# Patient Record
Sex: Female | Born: 1965 | Race: White | Hispanic: No | Marital: Married | Smoking: Never smoker
Health system: Southern US, Community
[De-identification: ages and names within clinical notes are randomized; demographics above are authoritative.]

## PROBLEM LIST (undated history)

## (undated) DIAGNOSIS — E079 Disorder of thyroid, unspecified: Secondary | ICD-10-CM

## (undated) DIAGNOSIS — E785 Hyperlipidemia, unspecified: Secondary | ICD-10-CM

## (undated) DIAGNOSIS — K219 Gastro-esophageal reflux disease without esophagitis: Secondary | ICD-10-CM

## (undated) HISTORY — PX: CHOLECYSTECTOMY: SHX55

---

## 2004-04-18 ENCOUNTER — Emergency Department: Payer: Self-pay | Admitting: Emergency Medicine

## 2004-04-19 ENCOUNTER — Ambulatory Visit: Payer: Self-pay | Admitting: Emergency Medicine

## 2006-12-29 ENCOUNTER — Ambulatory Visit: Payer: Self-pay | Admitting: Emergency Medicine

## 2007-08-22 ENCOUNTER — Ambulatory Visit: Payer: Self-pay | Admitting: Internal Medicine

## 2009-12-26 ENCOUNTER — Emergency Department: Payer: Self-pay | Admitting: Unknown Physician Specialty

## 2010-07-16 ENCOUNTER — Emergency Department: Payer: Self-pay | Admitting: Emergency Medicine

## 2010-08-27 ENCOUNTER — Emergency Department: Payer: Self-pay | Admitting: Unknown Physician Specialty

## 2012-11-17 ENCOUNTER — Emergency Department: Payer: Self-pay | Admitting: Emergency Medicine

## 2012-11-17 LAB — URINALYSIS, COMPLETE
Bilirubin,UR: NEGATIVE
Glucose,UR: NEGATIVE mg/dL (ref 0–75)
Ketone: NEGATIVE
Nitrite: NEGATIVE
Ph: 6 (ref 4.5–8.0)
Protein: NEGATIVE
Squamous Epithelial: 4
WBC UR: 7 /HPF (ref 0–5)

## 2012-11-17 LAB — COMPREHENSIVE METABOLIC PANEL
Albumin: 3.5 g/dL (ref 3.4–5.0)
Alkaline Phosphatase: 71 U/L (ref 50–136)
Bilirubin,Total: 0.2 mg/dL (ref 0.2–1.0)
Calcium, Total: 9.3 mg/dL (ref 8.5–10.1)
Chloride: 102 mmol/L (ref 98–107)
Creatinine: 0.86 mg/dL (ref 0.60–1.30)
Glucose: 119 mg/dL — ABNORMAL HIGH (ref 65–99)
Osmolality: 279 (ref 275–301)
Potassium: 3.9 mmol/L (ref 3.5–5.1)
SGOT(AST): 23 U/L (ref 15–37)
SGPT (ALT): 25 U/L (ref 12–78)

## 2012-11-17 LAB — CBC
HCT: 35.6 % (ref 35.0–47.0)
MCH: 31.1 pg (ref 26.0–34.0)
MCHC: 34.7 g/dL (ref 32.0–36.0)
MCV: 90 fL (ref 80–100)
Platelet: 261 10*3/uL (ref 150–440)
RBC: 3.97 10*6/uL (ref 3.80–5.20)
RDW: 13.5 % (ref 11.5–14.5)
WBC: 9 10*3/uL (ref 3.6–11.0)

## 2012-11-17 LAB — PREGNANCY, URINE: Pregnancy Test, Urine: NEGATIVE m[IU]/mL

## 2012-11-17 LAB — TROPONIN I: Troponin-I: <0.02 ng/mL

## 2015-08-26 ENCOUNTER — Encounter: Payer: Self-pay | Admitting: Emergency Medicine

## 2015-08-26 ENCOUNTER — Emergency Department
Admission: EM | Admit: 2015-08-26 | Discharge: 2015-08-26 | Disposition: A | Discharge status: Home / Self Care | Payer: Medicaid Other | Attending: Emergency Medicine | Admitting: Emergency Medicine

## 2015-08-26 DIAGNOSIS — M79651 Pain in right thigh: Secondary | ICD-10-CM | POA: Diagnosis not present

## 2015-08-26 DIAGNOSIS — E785 Hyperlipidemia, unspecified: Secondary | ICD-10-CM | POA: Diagnosis not present

## 2015-08-26 DIAGNOSIS — M791 Myalgia, unspecified site: Secondary | ICD-10-CM

## 2015-08-26 DIAGNOSIS — M79652 Pain in left thigh: Secondary | ICD-10-CM | POA: Diagnosis present

## 2015-08-26 DIAGNOSIS — E079 Disorder of thyroid, unspecified: Secondary | ICD-10-CM | POA: Diagnosis not present

## 2015-08-26 HISTORY — DX: Disorder of thyroid, unspecified: E07.9

## 2015-08-26 HISTORY — DX: Hyperlipidemia, unspecified: E78.5

## 2015-08-26 HISTORY — DX: Gastro-esophageal reflux disease without esophagitis: K21.9

## 2015-08-26 LAB — CBC WITH DIFFERENTIAL/PLATELET
Basophils Absolute: 0.1 10*3/uL (ref 0–0.1)
Eosinophils Absolute: 0.1 10*3/uL (ref 0–0.7)
Eosinophils Relative: 2 %
HCT: 36.4 % (ref 35.0–47.0)
HEMOGLOBIN: 12.3 g/dL (ref 12.0–16.0)
LYMPHS ABS: 2.5 10*3/uL (ref 1.0–3.6)
MCH: 31.1 pg (ref 26.0–34.0)
MCHC: 33.8 g/dL (ref 32.0–36.0)
MCV: 92 fL (ref 80.0–100.0)
Monocytes Absolute: 0.4 10*3/uL (ref 0.2–0.9)
NEUTROS ABS: 5.3 10*3/uL (ref 1.4–6.5)
Neutrophils Relative %: 62 %
Platelets: 206 10*3/uL (ref 150–440)
RBC: 3.96 MIL/uL (ref 3.80–5.20)
RDW: 14.1 % (ref 11.5–14.5)
WBC: 8.3 10*3/uL (ref 3.6–11.0)

## 2015-08-26 LAB — COMPREHENSIVE METABOLIC PANEL
ALBUMIN: 4.1 g/dL (ref 3.5–5.0)
ALK PHOS: 61 U/L (ref 38–126)
ALT: 18 U/L (ref 14–54)
ANION GAP: 8 (ref 5–15)
AST: 29 U/L (ref 15–41)
BUN: 17 mg/dL (ref 6–20)
CALCIUM: 9.6 mg/dL (ref 8.9–10.3)
CHLORIDE: 103 mmol/L (ref 101–111)
CO2: 24 mmol/L (ref 22–32)
Creatinine, Ser: 0.89 mg/dL (ref 0.44–1.00)
GFR calc Af Amer: >60 mL/min (ref >60)
GFR calc non Af Amer: >60 mL/min (ref >60)
GLUCOSE: 107 mg/dL — AB (ref 65–99)
Potassium: 4 mmol/L (ref 3.5–5.1)
Sodium: 135 mmol/L (ref 135–145)
Total Bilirubin: 0.5 mg/dL (ref 0.3–1.2)
Total Protein: 8.1 g/dL (ref 6.5–8.1)

## 2015-08-26 MED ORDER — METHOCARBAMOL 750 MG PO TABS: 750.0000 mg | ORAL_TABLET | Freq: Four times a day (QID) | ORAL | Status: DC

## 2015-08-26 MED ORDER — TRAMADOL HCL 50 MG PO TABS: 50.0000 mg | ORAL_TABLET | Freq: Four times a day (QID) | ORAL | Status: AC | PRN

## 2015-08-26 MED ORDER — NAPROXEN 500 MG PO TABS: 500.0000 mg | ORAL_TABLET | Freq: Two times a day (BID) | ORAL | Status: DC

## 2015-08-26 MED ORDER — NAPROXEN 500 MG PO TABS
500.0000 mg | ORAL_TABLET | Freq: Once | ORAL | Status: AC
  Administered 2015-08-26: 500 mg via ORAL
  Filled 2015-08-26: qty 1

## 2015-08-26 MED ORDER — METHOCARBAMOL 500 MG PO TABS
1000.0000 mg | ORAL_TABLET | Freq: Once | ORAL | Status: AC
  Administered 2015-08-26: 1000 mg via ORAL
  Filled 2015-08-26: qty 2

## 2015-08-26 MED ORDER — KETOROLAC TROMETHAMINE 60 MG/2ML IM SOLN
30.0000 mg | Freq: Once | INTRAMUSCULAR | Status: AC
  Administered 2015-08-26: 30 mg via INTRAMUSCULAR
  Filled 2015-08-26: qty 2

## 2015-08-26 NOTE — ED Provider Notes (Signed)
[facility] Emergency Department Provider Note   ____________________________________________  Time seen: Approximately 1:15 PM  I have reviewed the triage vital signs and the nursing notes.   HISTORY  Chief Complaint Leg Pain    HPI Connie Cooper is a 50 y.o. female patient complaining of bilateral thigh pain for 2 days. Patient stated pain increases with walking. Patient denies any back pain with this complaint. Patient state twice a day she has a walker but inclined when she takes a child to the bus stop and pick her up. Patient denies any chest pain or shortness of breath. Patient is also concerned because she has multiple varicose veins in her lower extremities. No palliative measures taken for this complaint.Patient rates the pain as a 4/10. Patient describes the pain as radiating from dull to sharp.   Past Medical History  Diagnosis Date  . Hyperlipemia   . GERD (gastroesophageal reflux disease)   . Thyroid disease     There are no active problems to display for this patient.   History reviewed. No pertinent past surgical history.  Current Outpatient Rx  Name  Route  Sig  Dispense  Refill  . methocarbamol (ROBAXIN-750) 750 MG tablet   Oral   Take 1 tablet (750 mg total) by mouth 4 (four) times daily.   20 tablet   0   . naproxen (NAPROSYN) 500 MG tablet   Oral   Take 1 tablet (500 mg total) by mouth 2 (two) times daily with a meal.   20 tablet   0   . traMADol (ULTRAM) 50 MG tablet   Oral   Take 1 tablet (50 mg total) by mouth every 6 (six) hours as needed.   20 tablet   0     Allergies Review of patient's allergies indicates no known allergies.  History reviewed. No pertinent family history.  Social History Social History  Substance Use Topics  . Smoking status: Never Smoker   . Smokeless tobacco: None  . Alcohol Use: No    Review of Systems Constitutional: No fever/chills Eyes: No visual changes. ENT: No sore  throat. Cardiovascular: Denies chest pain. Respiratory: Denies shortness of breath. Gastrointestinal: No abdominal pain.  No nausea, no vomiting.  No diarrhea.  No constipation. Genitourinary: Negative for dysuria. Musculoskeletal: Bilateral thigh pain Skin: Negative for rash. Neurological: Negative for headaches, focal weakness or numbness. Endocrine:Hyperlipidemia and hypothyroidism.  ____________________________________________   PHYSICAL EXAM:  VITAL SIGNS: ED Triage Vitals  Enc Vitals Group     BP 08/26/15 1306 125/96 mmHg     Pulse Rate 08/26/15 1306 78     Resp 08/26/15 1306 20     Temp 08/26/15 1306 98 F (36.7 C)     Temp Source 08/26/15 1306 Oral     SpO2 08/26/15 1306 100 %     Weight 08/26/15 1306 200 lb (90.719 kg)     Height 08/26/15 1306 5\' 3"  (1.6 m)     Head Cir --      Peak Flow --      Pain Score 08/26/15 1307 4     Pain Loc --      Pain Edu? --      Excl. in GC? --     Constitutional: Alert and oriented. Well appearing and in no acute distress. Eyes: Conjunctivae are normal. PERRL. EOMI. Head: Atraumatic. Nose: No congestion/rhinnorhea. Mouth/Throat: Mucous membranes are moist.  Oropharynx non-erythematous. Neck: No stridor.  No cervical spine tenderness to palpation.  Hematological/Lymphatic/Immunilogical: No cervical lymphadenopathy. Cardiovascular: Normal rate, regular rhythm. Grossly normal heart sounds.  Good peripheral circulation. Respiratory: Normal respiratory effort.  No retractions. Lungs CTAB. Gastrointestinal: Soft and nontender. No distention. No abdominal bruits. No CVA tenderness. Musculoskeletal:No loss deformities of the lower extremities. Multiple varicose veins. Patient tender palpation to anterior thighs. Neurologic:  Normal speech and language. No gross focal neurologic deficits are appreciated. No gait instability. Skin:  Skin is warm, dry and intact. No rash noted. Psychiatric: Mood and affect are normal. Speech and behavior  are normal.  ____________________________________________   LABS (all labs ordered are listed, but only abnormal results are displayed)  Labs Reviewed  COMPREHENSIVE METABOLIC PANEL - Abnormal; Notable for the following:    Glucose, Bld 107 (*)    All other components within normal limits  CBC WITH DIFFERENTIAL/PLATELET   ____________________________________________  EKG   ____________________________________________  RADIOLOGY   ____________________________________________   PROCEDURES  Procedure(s) performed: None  Critical Care performed: No  ____________________________________________   INITIAL IMPRESSION / ASSESSMENT AND PLAN / ED COURSE  Pertinent labs & imaging results that were available during my care of the patient were reviewed by me and considered in my medical decision making (see chart for details).  Muscle strain. Patient given discharge Instructions. Patient given a prescription for Robaxin, naproxen, tramadol. Patient advised follow-up with her family doctor no improvement in one week. Return to ER for condition worsens. ____________________________________________   FINAL CLINICAL IMPRESSION(S) / ED DIAGNOSES  Final diagnoses:  Myalgia      NEW MEDICATIONS STARTED DURING THIS VISIT:  New Prescriptions   METHOCARBAMOL (ROBAXIN-750) 750 MG TABLET    Take 1 tablet (750 mg total) by mouth 4 (four) times daily.   NAPROXEN (NAPROSYN) 500 MG TABLET    Take 1 tablet (500 mg total) by mouth 2 (two) times daily with a meal.   TRAMADOL (ULTRAM) 50 MG TABLET    Take 1 tablet (50 mg total) by mouth every 6 (six) hours as needed.     Note:  This document was prepared using Dragon voice recognition software and may include unintentional dictation errors.     Joni Reining, PA-C 08/26/15 1412  Jene Every, MD 08/26/15 [phone]

## 2015-08-26 NOTE — ED Notes (Signed)
Pt to ed with c/o bilat upper leg pain x 2 days.  Pt states increased pain with walking.  Pt denies back pain.

## 2016-06-27 ENCOUNTER — Other Ambulatory Visit: Payer: Self-pay | Admitting: Family Medicine

## 2016-06-27 DIAGNOSIS — Z1231 Encounter for screening mammogram for malignant neoplasm of breast: Secondary | ICD-10-CM

## 2016-07-28 ENCOUNTER — Ambulatory Visit: Payer: Medicaid Other

## 2016-09-12 ENCOUNTER — Encounter: Payer: Self-pay | Admitting: Emergency Medicine

## 2016-09-12 ENCOUNTER — Emergency Department
Admission: EM | Admit: 2016-09-12 | Discharge: 2016-09-12 | Disposition: A | Discharge status: Home / Self Care | Payer: Medicaid Other | Attending: Student in an Organized Health Care Education/Training Program | Admitting: Student in an Organized Health Care Education/Training Program

## 2016-09-12 DIAGNOSIS — Z79899 Other long term (current) drug therapy: Secondary | ICD-10-CM | POA: Insufficient documentation

## 2016-09-12 DIAGNOSIS — R200 Anesthesia of skin: Secondary | ICD-10-CM | POA: Diagnosis present

## 2016-09-12 DIAGNOSIS — R202 Paresthesia of skin: Secondary | ICD-10-CM | POA: Insufficient documentation

## 2016-09-12 MED ORDER — GABAPENTIN 300 MG PO CAPS: 300.0000 mg | ORAL_CAPSULE | Freq: Three times a day (TID) | ORAL | 1 refills | Status: AC

## 2016-09-12 NOTE — ED Notes (Signed)
Here for hands and finger numbness, seen for the same in the past and told it was carpal tunnel. States "I don't think that is right".

## 2016-09-12 NOTE — Discharge Instructions (Signed)
You should follow-up with your provider for continued symptoms and diagnostic testing. Take the medicine for nerve irritation as directed.

## 2016-09-12 NOTE — ED Provider Notes (Signed)
[facility] Emergency Department Provider Note ____________________________________________  Time seen: 2141  I have reviewed the triage vital signs and the nursing notes.  HISTORY  Chief Complaint  Numbness  HPI Connie Cooper is a 51 y.o. female visits to the ED for evaluation of numbness from her hands to her arms or the past several months. The patient describes being evaluated by her primary care provider, and been told she has carpal tunnel syndrome. She describes a primary provider put her on 400 mg ibuprofen daily, she denies any significant benefit. She claims she is not sure that that is what her condition actually is. She reports generalized paresthesias and numbness to the hands bilaterally as well as referred pain up the forearms and into the upper shoulders. She denies any significant swelling of the hands, skin Color changes, or grip changes. She has not been evaluated with a diagnostic nerve conduction study. She also denies any recent injury, accident, or trauma. No history of degenerative disc disease, neuromuscular disorders, or other autoimmune disorders are noted.  Past Medical History:  Diagnosis Date  . GERD (gastroesophageal reflux disease)   . Hyperlipemia   . Thyroid disease     There are no active problems to display for this patient.   Past Surgical History:  Procedure Laterality Date  . CHOLECYSTECTOMY      Prior to Admission medications   Medication Sig Start Date End Date Taking? Authorizing Provider  gabapentin (NEURONTIN) 300 MG capsule Take 1 capsule (300 mg total) by mouth 3 (three) times daily. 09/12/16 11/11/16  Yarelly Kuba, Charlesetta Ivory, PA-C  methocarbamol (ROBAXIN-750) 750 MG tablet Take 1 tablet (750 mg total) by mouth 4 (four) times daily. 08/26/15   Joni Reining, PA-C  naproxen (NAPROSYN) 500 MG tablet Take 1 tablet (500 mg total) by mouth 2 (two) times daily with a meal. 08/26/15   Joni Reining, PA-C    Allergies Patient has no known allergies.  No family history on file.  Social History Social History  Substance Use Topics  . Smoking status: Never Smoker  . Smokeless tobacco: Not on file  . Alcohol use No    Review of Systems  Constitutional: Negative for fever. Cardiovascular: Negative for chest pain. Respiratory: Negative for shortness of breath. Musculoskeletal: Negative for back pain. Skin: Negative for rash. Neurological: Negative for headaches, focal weakness or numbness. Reports bilateral upper extremity paresthesias as above. ____________________________________________  PHYSICAL EXAM:  VITAL SIGNS: ED Triage Vitals [09/12/16 2050]  Enc Vitals Group     BP 130/78     Pulse Rate 92     Resp 18     Temp 98.3 F (36.8 C)     Temp Source Oral     SpO2 98 %     Weight 230 lb (104.3 kg)     Height 5\' 3"  (1.6 m)     Head Circumference      Peak Flow      Pain Score 0     Pain Loc      Pain Edu?      Excl. in GC?     Constitutional: Alert and oriented. Well appearing and in no distress. Head: Normocephalic and atraumatic. Eyes: Conjunctivae are normal. PERRL. Normal extraocular movements Neck: Supple. No thyromegaly. Cardiovascular: Normal rate, regular rhythm. Normal distal pulses. Respiratory: Normal respiratory effort. No wheezes/rales/rhonchi. Musculoskeletal: Normal spinal alignment without midline tenderness, spasm, deformity, or step-off. Normal composite fist and grip strength bilaterally. Nontender with normal range  of motion in all extremities.  Neurologic: Cranial nerves II through XII grossly intact. Normal UE DTRs bilaterally. Normal intrinsic and opposition testing. Mildly positive for carpal Tinel's on the right hand and negative carpal Tinel's on the left. Negative cubital Tinel's bilaterally. Normal gait without ataxia. Normal speech and language. No gross focal neurologic deficits are appreciated. Skin:  Skin is warm, dry and intact. No  rash noted. Psychiatric: Mood and affect are normal. Patient exhibits appropriate insight and judgment. ____________________________________________  INITIAL IMPRESSION / ASSESSMENT AND PLAN / ED COURSE  Patient with clinical presentation of bilateral hand paresthesias with symptoms referred through the upper extremities. Her exam is otherwise benign at this point for any acute neuromuscular deficit. Patient is advised to follow with the primary care provider for definitive diagnosis of carpal tunnel syndrome, with a nerve conduction study. She is further advised she may continue the ibuprofen as prescribed. And may start a trial of gabapentin for neurogenic pain. She will follow-up with primary provider for reevaluation in 2-4 weeks. She is given instruction on the step wise increase of her gabapentin over the next 2 weeks beginning with a nighttime dose. ____________________________________________  FINAL CLINICAL IMPRESSION(S) / ED DIAGNOSES  Final diagnoses:  Paresthesia      Karmen StabsMenshew, Charlesetta IvoryJenise V Bacon, PA-C 09/12/16 2346    Willy Eddyobinson, Patrick, MD 09/13/16 0006

## 2016-09-12 NOTE — ED Notes (Signed)
Patient denies pain and is resting comfortably.  

## 2016-09-12 NOTE — ED Triage Notes (Signed)
Pt presents to ED with c/o numbness to her hands and arms for the past several months. Pt was seen by pcp and told she has carpal tunnel. Pt states she does "not believe that is what it is". Pt states she is just "tired of them feeling this way".

## 2016-10-13 ENCOUNTER — Encounter: Payer: Self-pay | Admitting: Emergency Medicine

## 2016-10-13 ENCOUNTER — Emergency Department: Payer: Medicaid Other

## 2016-10-13 ENCOUNTER — Emergency Department
Admission: EM | Admit: 2016-10-13 | Discharge: 2016-10-13 | Disposition: A | Discharge status: Home / Self Care | Payer: Medicaid Other | Attending: Emergency Medicine | Admitting: Emergency Medicine

## 2016-10-13 DIAGNOSIS — F17220 Nicotine dependence, chewing tobacco, uncomplicated: Secondary | ICD-10-CM | POA: Diagnosis not present

## 2016-10-13 DIAGNOSIS — R202 Paresthesia of skin: Secondary | ICD-10-CM | POA: Diagnosis not present

## 2016-10-13 DIAGNOSIS — Z79899 Other long term (current) drug therapy: Secondary | ICD-10-CM | POA: Insufficient documentation

## 2016-10-13 DIAGNOSIS — Z9114 Patient's other noncompliance with medication regimen: Secondary | ICD-10-CM | POA: Diagnosis not present

## 2016-10-13 NOTE — ED Provider Notes (Signed)
[facility] Emergency Department Provider Note ____________________________________________  Time seen: 1550  I have reviewed the triage vital signs and the nursing notes.  HISTORY  Chief Complaint  Numbness  HPI Connie Cooper is a 51 y.o. female patient notices ED for this similar complaint was evaluated by me about 4 weeks prior. She returns today with continued complaints of bilateral hand and forearm numbness that has been persistent for the last several months. She was started on gabapentin perception with me last month, but admits that she vomited the medication daily at bedtime over the last week. She is also failed to follow-up with her primary care provider as previously discussed. She returns today without any interim injury, accident, trauma, or illness.  Past Medical History:  Diagnosis Date  . GERD (gastroesophageal reflux disease)   . Hyperlipemia   . Thyroid disease     There are no active problems to display for this patient.   Past Surgical History:  Procedure Laterality Date  . CHOLECYSTECTOMY      Prior to Admission medications   Medication Sig Start Date End Date Taking? Authorizing Provider  gabapentin (NEURONTIN) 300 MG capsule Take 1 capsule (300 mg total) by mouth 3 (three) times daily. 09/12/16 11/11/16  Favio Moder, Charlesetta Ivory, PA-C  methocarbamol (ROBAXIN-750) 750 MG tablet Take 1 tablet (750 mg total) by mouth 4 (four) times daily. 08/26/15   Joni Reining, PA-C  naproxen (NAPROSYN) 500 MG tablet Take 1 tablet (500 mg total) by mouth 2 (two) times daily with a meal. 08/26/15   Joni Reining, PA-C    Allergies Patient has no known allergies.  History reviewed. No pertinent family history.  Social History Social History  Substance Use Topics  . Smoking status: Never Smoker  . Smokeless tobacco: Current User    Types: Chew, Snuff  . Alcohol use No    Review of Systems  Constitutional: Negative for  fever. Cardiovascular: Negative for chest pain. Respiratory: Negative for shortness of breath. Musculoskeletal: Negative for back pain. Skin: Negative for rash. Neurological: Negative for headaches, focal weakness or numbness. Reports BUE paresthesias above.  ____________________________________________  PHYSICAL EXAM:  VITAL SIGNS: ED Triage Vitals [10/13/16 1516]  Enc Vitals Group     BP 136/81     Pulse Rate 89     Resp 20     Temp 98.3 F (36.8 C)     Temp Source Oral     SpO2 97 %     Weight 220 lb (99.8 kg)     Height 5\' 3"  (1.6 m)     Head Circumference      Peak Flow      Pain Score      Pain Loc      Pain Edu?      Excl. in GC?     Constitutional: Alert and oriented. Well appearing and in no distress. Head: Normocephalic and atraumatic. Cardiovascular: Normal rate, regular rhythm. Normal distal pulses. Respiratory: Normal respiratory effort.  Musculoskeletal: Nontender with normal range of motion in all extremities.  Neurologic: Cranial nerves II through XII grossly intact. Normal gait without ataxia. Normal speech and language. No gross focal neurologic deficits are appreciated. Skin:  Skin is warm, dry and intact. No rash noted.  ____________________________________________   RADIOLOGY  Cervical Spine FINDINGS: There is no evidence of fracture or subluxation. Vertebral bodies demonstrate normal height and alignment. Intervertebral disc spaces are preserved. Prevertebral soft tissues are within normal limits. The provided odontoid  view demonstrates no significant abnormality. Minimal anterior and posterior disc osteophyte complexes are suggested at C5-C6.  I, Dovid Bartko, Charlesetta IvoryJenise V Bacon, personally viewed and evaluated these images (plain radiographs) as part of my medical decision making, as well as reviewing the written report by the radiologist. ____________________________________________  INITIAL IMPRESSION / ASSESSMENT AND PLAN / ED COURSE  Patient  with bilateral upper extremities paresthesias that are chronic in nature. The patient has been noncompliant with medications and prescribed follow-up. She will be discharged with instructions to use ramp up her gabapentin as previously prescribed. She is also referred back to her primary care provider for further evaluation and management. ____________________________________________  FINAL CLINICAL IMPRESSION(S) / ED DIAGNOSES  Final diagnoses:  Paresthesias      Karmen StabsMenshew, Charlesetta IvoryJenise V Bacon, PA-C 10/13/16 1650    Myrna BlazerSchaevitz, David Matthew, MD 10/13/16 2126

## 2016-10-13 NOTE — Discharge Instructions (Signed)
Your x-ray shows normal disc height throughout the neck. You should follow-up with your provider for further evaluation and testing as discussed.  Beginning tonight, take 2 tabs at bedtime and 1 tab during the morning.  Next Friday take 2 tabs at bedtime, 1 tab in the morning, and 1 tab midday. On July 6th take 2 tabs at bedtime, 2 tabs in the morning, and 1 tab midday.

## 2016-10-13 NOTE — ED Notes (Signed)
See triage note  States she is here for intermittent numbness to both arms for several months  Denies any injury  Grips are equal

## 2016-10-13 NOTE — ED Triage Notes (Signed)
Pt to ED from home c/o bilateral arm numbness intermittently from shoulders to hands for several months.  Pt prescribed gabapentin but says it's not helping.

## 2017-02-16 ENCOUNTER — Emergency Department
Admission: EM | Admit: 2017-02-16 | Discharge: 2017-02-16 | Disposition: A | Discharge status: Home / Self Care | Payer: Medicaid Other | Attending: Emergency Medicine | Admitting: Emergency Medicine

## 2017-02-16 ENCOUNTER — Encounter: Payer: Self-pay | Admitting: Emergency Medicine

## 2017-02-16 ENCOUNTER — Emergency Department: Payer: Medicaid Other

## 2017-02-16 DIAGNOSIS — Y929 Unspecified place or not applicable: Secondary | ICD-10-CM | POA: Diagnosis not present

## 2017-02-16 DIAGNOSIS — Y9389 Activity, other specified: Secondary | ICD-10-CM | POA: Insufficient documentation

## 2017-02-16 DIAGNOSIS — Z79899 Other long term (current) drug therapy: Secondary | ICD-10-CM | POA: Insufficient documentation

## 2017-02-16 DIAGNOSIS — S46912A Strain of unspecified muscle, fascia and tendon at shoulder and upper arm level, left arm, initial encounter: Secondary | ICD-10-CM

## 2017-02-16 DIAGNOSIS — Y999 Unspecified external cause status: Secondary | ICD-10-CM | POA: Diagnosis not present

## 2017-02-16 DIAGNOSIS — S4992XD Unspecified injury of left shoulder and upper arm, subsequent encounter: Secondary | ICD-10-CM | POA: Diagnosis present

## 2017-02-16 DIAGNOSIS — S46912D Strain of unspecified muscle, fascia and tendon at shoulder and upper arm level, left arm, subsequent encounter: Secondary | ICD-10-CM | POA: Diagnosis not present

## 2017-02-16 DIAGNOSIS — X500XXD Overexertion from strenuous movement or load, subsequent encounter: Secondary | ICD-10-CM | POA: Diagnosis not present

## 2017-02-16 MED ORDER — KETOROLAC TROMETHAMINE 30 MG/ML IJ SOLN
30.0000 mg | Freq: Once | INTRAMUSCULAR | Status: AC
  Administered 2017-02-16: 30 mg via INTRAMUSCULAR
  Filled 2017-02-16: qty 1

## 2017-02-16 NOTE — ED Triage Notes (Signed)
FIRST NURSE NOTE-pt c/o left shoulder pain and wants second opinion. Has seen [facility] for same. Ambulatory with no difficulty.

## 2017-02-16 NOTE — ED Triage Notes (Signed)
Pt states that a few days ago she lifted a bed and mattress, pt states that she has already had trouble with her arms going numb, pt states that since lifting she has had pain in the left shoulder, pt states that she was [facility] and was told she pulled something in it and reports that the examiner could touch her shoulder in a certain place and it hurt every time

## 2017-02-16 NOTE — ED Provider Notes (Signed)
[facility] Emergency Department Provider Note  ____________________________________________   First MD Initiated Contact with Patient 02/16/17 1419     (approximate)  I have reviewed the triage vital signs and the nursing notes.   HISTORY  Chief Complaint Shoulder Pain   HPI Connie Cooper is a 51 y.o. female he is here complaining of left shoulder pain after lifting a bed and mattress. Patient states that she has continued to have problems with her shoulder. She was seen at [facility] and prescribed Flexeril 5 mg to be taken at bedtime and ibuprofen 400 mg. She states that she still feels a "pulling sensation in her shoulder. She has come to the emergency room for a second opinion. Currently she rates her pain as a 0/10.   Past Medical History:  Diagnosis Date  . GERD (gastroesophageal reflux disease)   . Hyperlipemia   . Thyroid disease     There are no active problems to display for this patient.   Past Surgical History:  Procedure Laterality Date  . CHOLECYSTECTOMY      Prior to Admission medications   Medication Sig Start Date End Date Taking? Authorizing Provider  gabapentin (NEURONTIN) 300 MG capsule Take 1 capsule (300 mg total) by mouth 3 (three) times daily. 09/12/16 11/11/16  Menshew, Charlesetta Ivory, PA-C    Allergies Patient has no known allergies.  History reviewed. No pertinent family history.  Social History Social History  Substance Use Topics  . Smoking status: Never Smoker  . Smokeless tobacco: Current User    Types: Chew, Snuff  . Alcohol use No    Review of Systems  Constitutional: No fever/chills Cardiovascular: Denies chest pain. Respiratory: Denies shortness of breath. Gastrointestinal: No abdominal pain.  No nausea, no vomiting.   Musculoskeletal: positive for left shoulder pain. Skin: Negative for rash. Neurological: Negative for headaches, focal weakness or  numbness. ____________________________________________   PHYSICAL EXAM:  VITAL SIGNS: ED Triage Vitals  Enc Vitals Group     BP 02/16/17 1404 (!) 143/91     Pulse Rate 02/16/17 1404 (!) 106     Resp 02/16/17 1404 16     Temp 02/16/17 1404 98.4 F (36.9 C)     Temp Source 02/16/17 1404 Oral     SpO2 02/16/17 1404 97 %     Weight 02/16/17 1405 230 lb (104.3 kg)     Height 02/16/17 1405 5\' 3"  (1.6 m)     Head Circumference --      Peak Flow --      Pain Score 02/16/17 1404 0     Pain Loc --      Pain Edu? --      Excl. in GC? --    Constitutional: Alert and oriented. Well appearing and in no acute distress. Eyes: Conjunctivae are normal.  Head: Atraumatic. Nose: No congestion/rhinnorhea. Neck: No stridor.   Cardiovascular: Normal rate, regular rhythm. Grossly normal heart sounds.  Good peripheral circulation. Respiratory: Normal respiratory effort.  No retractions. Lungs CTAB. Musculoskeletal: examination of the left shoulder there is no gross deformity however there is generalized tenderness to palpation both anteriorly and posteriorly.No soft tissue swelling is present. Range of motion is restricted in all planes secondary to patient's pain. No ecchymosis or abrasions were seen. Neurologic:  Normal speech and language. No gross focal neurologic deficits are appreciated.  Skin:  Skin is warm, dry and intact Psychiatric: Mood and affect are normal. Speech and behavior are normal.  ____________________________________________  LABS (all labs ordered are listed, but only abnormal results are displayed)  Labs Reviewed - No data to display  RADIOLOGY  Dg Shoulder Left  Result Date: 02/16/2017 CLINICAL DATA:  Left shoulder pain following lifting injury last week. No previous injury or prior relevant surgery. EXAM: LEFT SHOULDER - 2+ VIEW COMPARISON:  None. FINDINGS: The mineralization and alignment are normal. There is no evidence of acute fracture or dislocation. The  subacromial space is maintained. There are mild acromioclavicular degenerative changes. IMPRESSION: No acute osseous findings. Mild acromioclavicular degenerative changes. Electronically Signed   By: Carey BullocksWilliam  Veazey M.D.   On: 02/16/2017 14:33    ____________________________________________   PROCEDURES  Procedure(s) performed: None  Procedures  Critical Care performed: No  ____________________________________________   INITIAL IMPRESSION / ASSESSMENT AND PLAN / ED COURSE  Patient was reassured that there was some degenerative changes on her x-ray but no acute findings. Medications were reviewed and patient is currently taking Flexeril 5 mg at bedtime and ibuprofen as directed by her PCP. She is encouraged to continue this medication. She will use ice or heat to her shoulder as needed for discomfort. She is to follow-up with her PCP if not improving and approximately 2 weeks.  ____________________________________________   FINAL CLINICAL IMPRESSION(S) / ED DIAGNOSES  Final diagnoses:  Strain of left shoulder, initial encounter      NEW MEDICATIONS STARTED DURING THIS VISIT:  Current Discharge Medication List       Note:  This document was prepared using Dragon voice recognition software and may include unintentional dictation errors.    Tommi RumpsSummers, Rhonda L, PA-C 02/16/17 1513    Nita SickleVeronese, Hondo, MD 02/20/17 0930

## 2017-02-16 NOTE — Discharge Instructions (Signed)
Continue medication written by your primary care provider. Continue Flexeril 5 mg at bedtime and continue ibuprofen as directed. You  may use ice or heat to your  shoulder as needed for pain. Follow-up with your PCP in 2 weeks if any continued problems.

## 2018-07-30 ENCOUNTER — Other Ambulatory Visit: Payer: Self-pay | Admitting: Family Medicine

## 2018-07-30 DIAGNOSIS — Z1231 Encounter for screening mammogram for malignant neoplasm of breast: Secondary | ICD-10-CM

## 2018-08-11 IMAGING — CR DG SHOULDER 2+V*L*
1 series · 3 of 3 positions shown · non-contrast
Comparison: None.

CLINICAL DATA: Left shoulder pain following lifting injury last
week. No previous injury or prior relevant surgery.

EXAM:
LEFT SHOULDER - 2+ VIEW

[Series 1: dg shoulder left · 0.14mm/px · 3 of 3 slices shown]
[im 1/3]
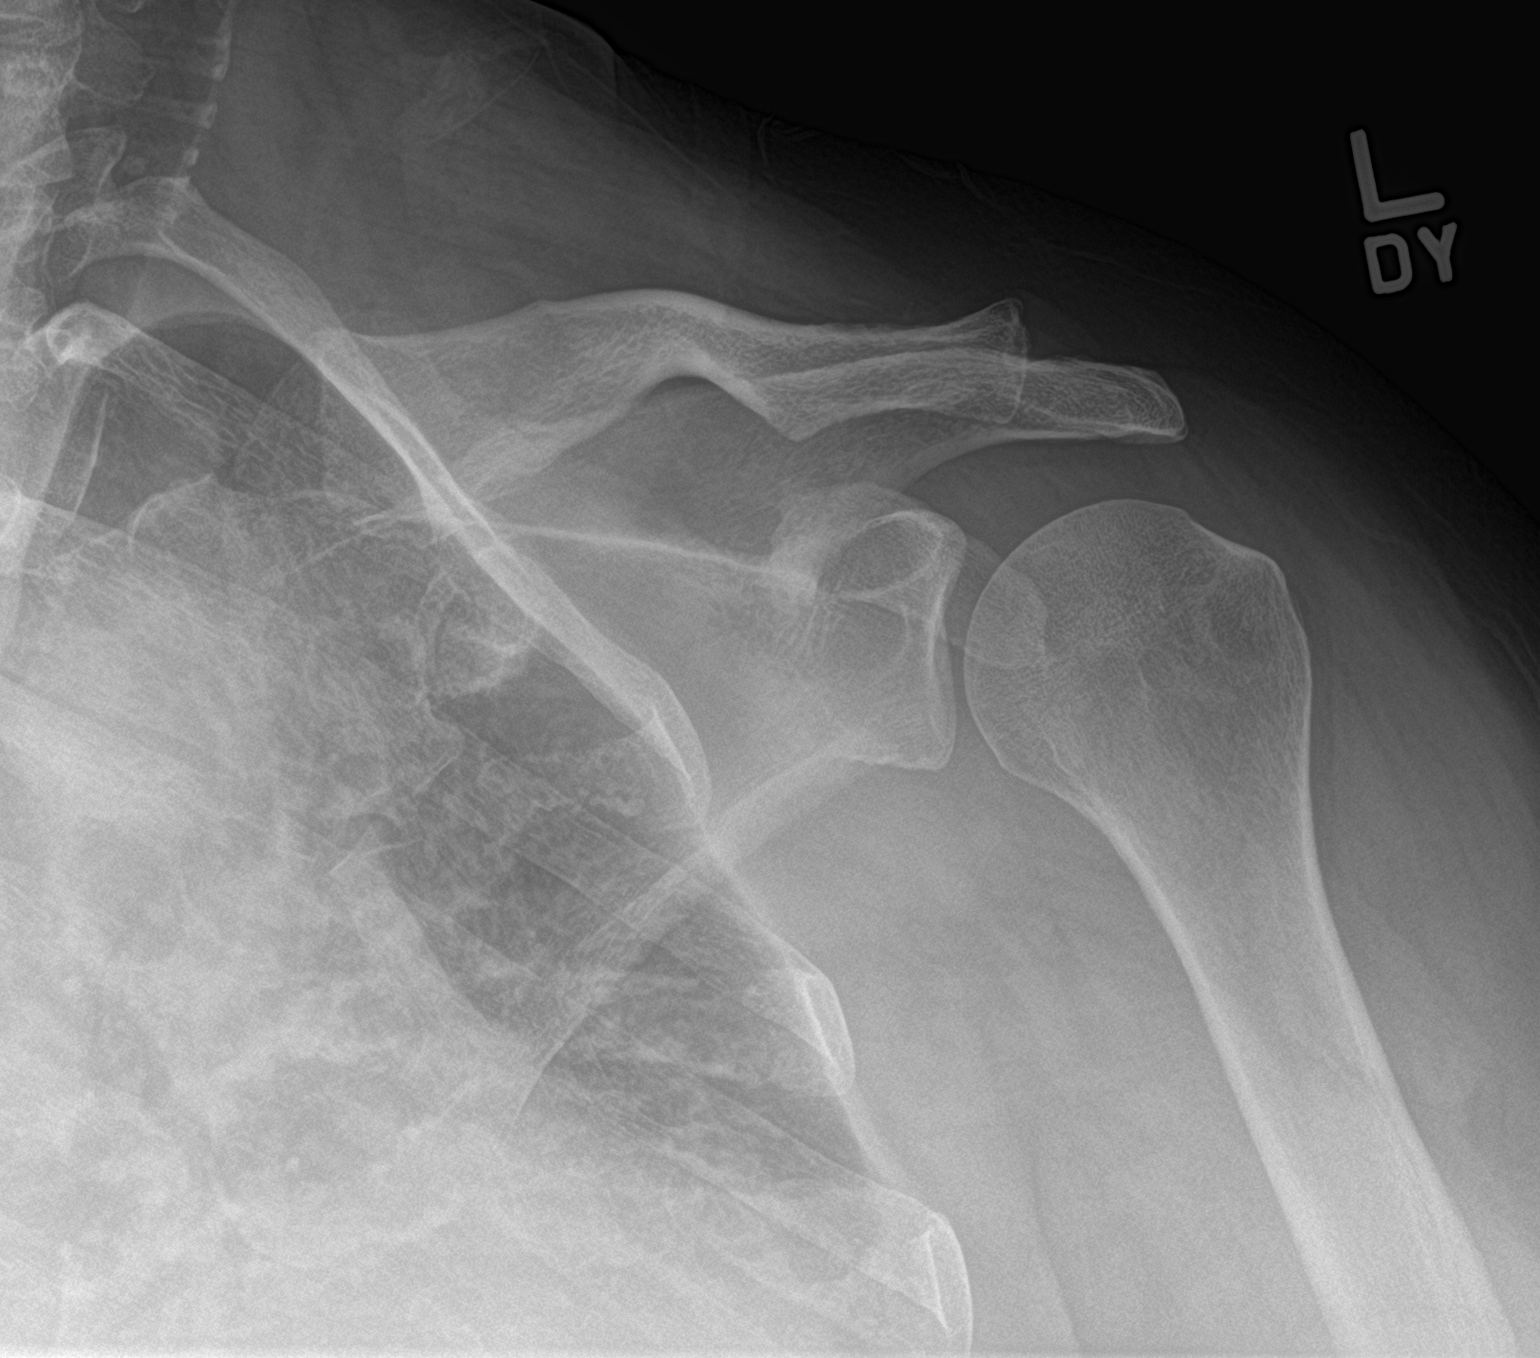
[im 2/3]
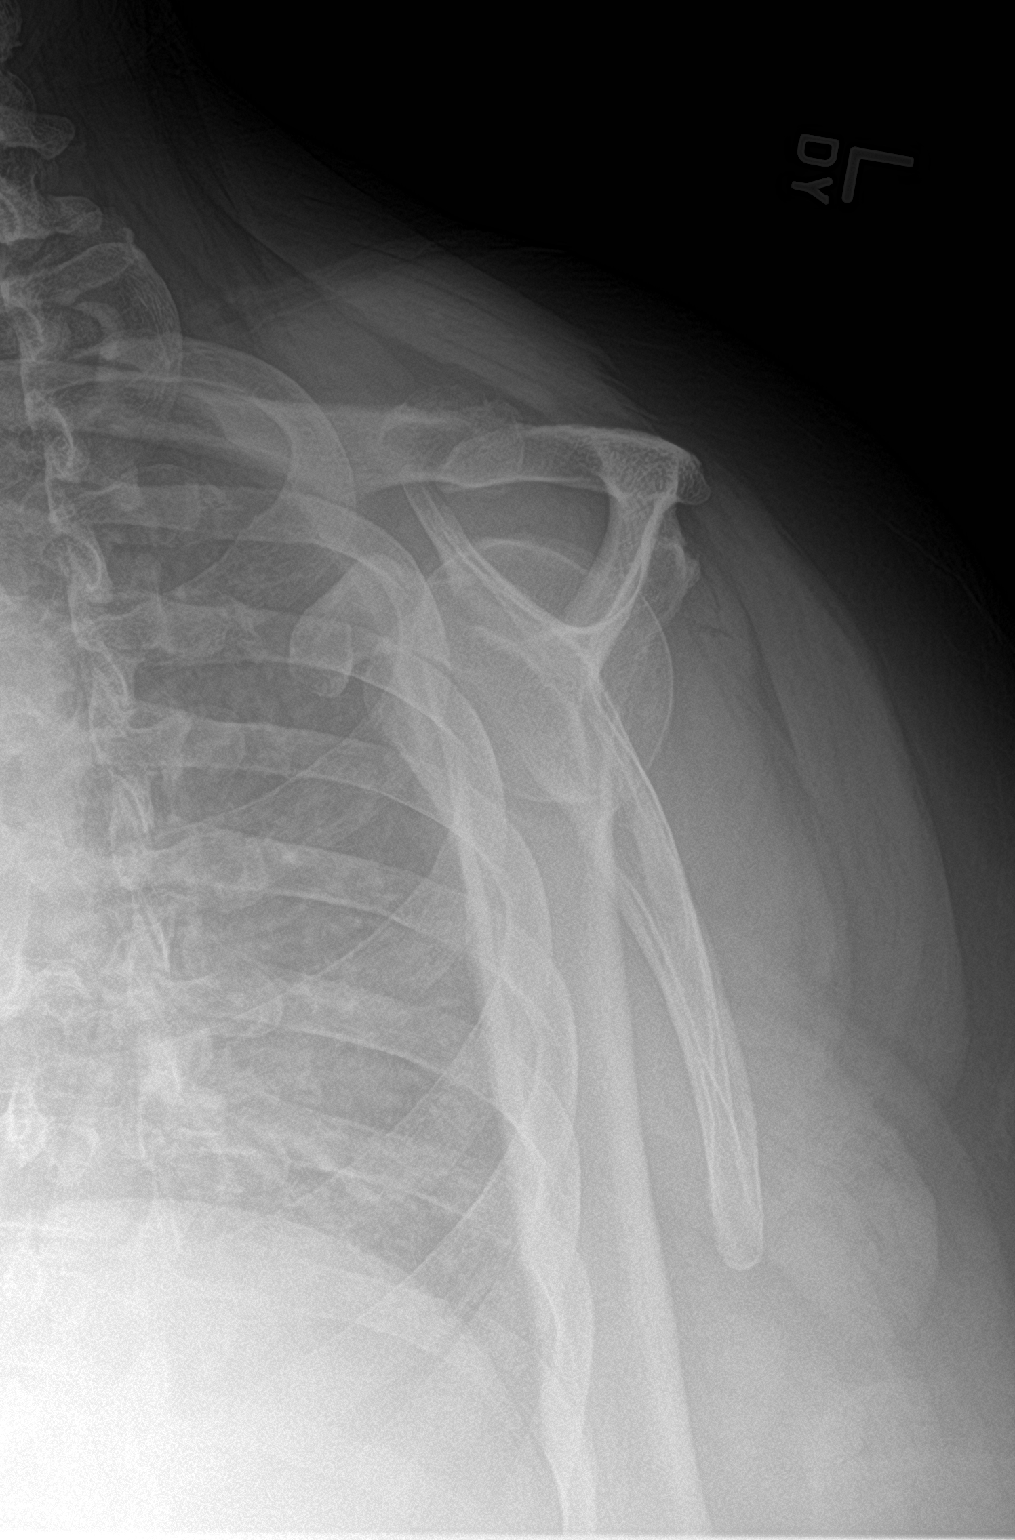
[im 3/3]
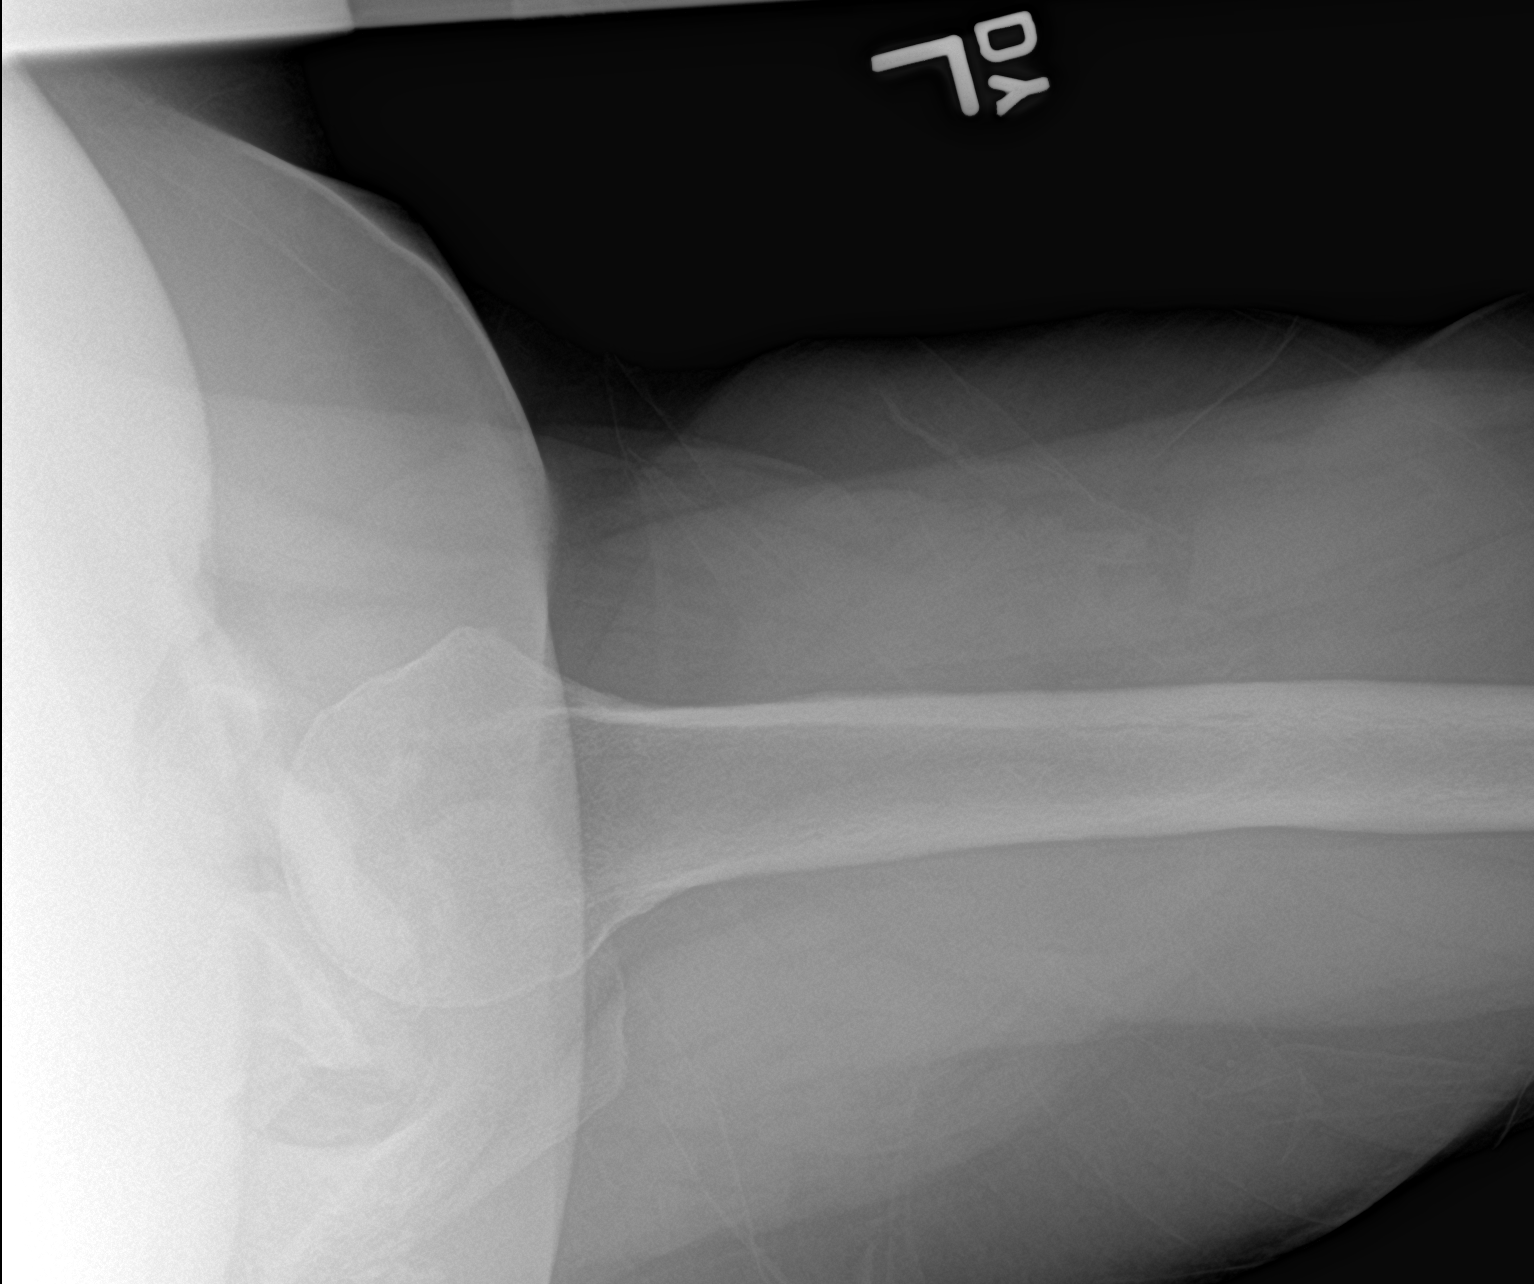

[3 of 3 positions shown; findings below may reference images not displayed]

FINDINGS: The mineralization and alignment are normal. There is no evidence of
acute fracture or dislocation. The subacromial space is maintained.
There are mild acromioclavicular degenerative changes.
IMPRESSION: No acute osseous findings. Mild acromioclavicular degenerative
changes.

## 2020-05-01 ENCOUNTER — Other Ambulatory Visit: Payer: Self-pay

## 2020-05-01 ENCOUNTER — Emergency Department
Admission: EM | Admit: 2020-05-01 | Discharge: 2020-05-01 | Disposition: A | Discharge status: Home / Self Care | Payer: Medicaid Other | Attending: Emergency Medicine | Admitting: Emergency Medicine

## 2020-05-01 ENCOUNTER — Encounter: Payer: Self-pay | Admitting: Emergency Medicine

## 2020-05-01 DIAGNOSIS — R682 Dry mouth, unspecified: Secondary | ICD-10-CM | POA: Diagnosis present

## 2020-05-01 DIAGNOSIS — H6981 Other specified disorders of Eustachian tube, right ear: Secondary | ICD-10-CM | POA: Diagnosis not present

## 2020-05-01 DIAGNOSIS — U071 COVID-19: Secondary | ICD-10-CM | POA: Diagnosis not present

## 2020-05-01 MED ORDER — BIOTENE DRY MOUTH MT LIQD: 15.0000 mL | OROMUCOSAL | 0 refills | Status: AC | PRN

## 2020-05-01 MED ORDER — PSEUDOEPHEDRINE HCL ER 120 MG PO TB12: 120.0000 mg | ORAL_TABLET | Freq: Two times a day (BID) | ORAL | 2 refills | Status: AC | PRN

## 2020-05-01 NOTE — Discharge Instructions (Signed)
Follow discharge care instructions and use OTC oral rinse for dry mouth complaint.  If no improvement and 7 to 10 days follow-up with ENT clinic listed in your discharge care instructions.  Advised self quarantine pending results of COVID-19 test.

## 2020-05-01 NOTE — ED Notes (Addendum)
Patient discharged home, patient received discharge papers. Patient appropriate and cooperative, Denies SI/HI AVH. Vital signs taken. NAD noted.

## 2020-05-01 NOTE — ED Provider Notes (Signed)
[facility] Emergency Department Provider Note   ____________________________________________   Event Date/Time   First MD Initiated Contact with Patient 05/01/20 1057     (approximate)  I have reviewed the triage vital signs and the nursing notes.   HISTORY  Chief Complaint dry throat and Ear Pain    HPI Connie Cooper is a 55 y.o. female patient presents with 3 weeks of dry mouth sensation.  Patient states well hydrated with water juice and other beverages but does not seem to wet her throat.  Patient also complain of right ear discomfort with mild hearing loss.  Patient describes the discomfort as "pressure".  Patient denies loss of smell or taste.  No recent travel or known contact with COVID-19.  Patient not taken the vaccine for COVID-19 or the flu shot for this season.         Past Medical History:  Diagnosis Date  . GERD (gastroesophageal reflux disease)   . Hyperlipemia   . Thyroid disease     There are no problems to display for this patient.   Past Surgical History:  Procedure Laterality Date  . CHOLECYSTECTOMY      Prior to Admission medications   Medication Sig Start Date End Date Taking? Authorizing Provider  antiseptic oral rinse (BIOTENE) LIQD 15 mLs by Mouth Rinse route as needed for dry mouth. 05/01/20  Yes Joni Reining, PA-C  pseudoephedrine (SUDAFED) 120 MG 12 hr tablet Take 1 tablet (120 mg total) by mouth 2 (two) times daily as needed for congestion. 05/01/20 05/01/21 Yes Joni Reining, PA-C  gabapentin (NEURONTIN) 300 MG capsule Take 1 capsule (300 mg total) by mouth 3 (three) times daily. 09/12/16 11/11/16  Menshew, Charlesetta Ivory, PA-C    Allergies Patient has no known allergies.  No family history on file.  Social History Social History   Tobacco Use  . Smoking status: Never Smoker  . Smokeless tobacco: Current User    Types: Chew, Snuff  Substance Use Topics  . Alcohol use: No  . Drug use: No    Review  of Systems Constitutional: No fever/chills Eyes: No visual changes. ENT: No sore throat.  Dry mouth and right ear pain Cardiovascular: Denies chest pain. Respiratory: Denies shortness of breath. Gastrointestinal: No abdominal pain.  No nausea, no vomiting.  No diarrhea.  No constipation. Genitourinary: Negative for dysuria. Musculoskeletal: Negative for back pain. Skin: Negative for rash. Neurological: Negative for headaches, focal weakness or numbness. Endocrine:  Hyperlipidemia and hypothyroidism ____________________________________________   PHYSICAL EXAM:  VITAL SIGNS: ED Triage Vitals  Enc Vitals Group     BP 05/01/20 1054 (!) 142/57     Pulse Rate 05/01/20 1054 99     Resp 05/01/20 1054 20     Temp 05/01/20 1054 98.2 F (36.8 C)     Temp Source 05/01/20 1054 Oral     SpO2 05/01/20 1054 100 %     Weight --      Height --      Head Circumference --      Peak Flow --      Pain Score 05/01/20 1053 0     Pain Loc --      Pain Edu? --      Excl. in [place]? --     Constitutional: Alert and oriented. Well appearing and in no acute distress. Eyes: Conjunctivae are normal. PERRL. EOMI. Head: Atraumatic. Nose: No congestion/rhinnorhea. Mouth/Throat: Mucous membranes are moist.  Oropharynx non-erythematous.  Noncoated  tongue. EARS: Mild edema but no erythema to the right TM. Neck: No stridor.  Hematological/Lymphatic/Immunilogical: No cervical lymphadenopathy. Cardiovascular: Normal rate, regular rhythm. Grossly normal heart sounds.  Good peripheral circulation. Respiratory: Normal respiratory effort.  No retractions. Lungs CTAB. Genitourinary: Deferred Musculoskeletal: No lower extremity tenderness nor edema.  No joint effusions. Neurologic:  Normal speech and language. No gross focal neurologic deficits are appreciated. No gait instability. Skin:  Skin is warm, dry and intact. No rash noted. Psychiatric: Mood and affect are normal. Speech and behavior are  normal.  ____________________________________________   LABS (all labs ordered are listed, but only abnormal results are displayed)  Labs Reviewed  SARS CORONAVIRUS 2 (TAT 6-24 HRS)   ____________________________________________  EKG   ____________________________________________  RADIOLOGY I, Joni Reining, personally viewed and evaluated these images (plain radiographs) as part of my medical decision making, as well as reviewing the written report by the radiologist.  ED MD interpretation:    Official radiology report(s): No results found.  ____________________________________________   PROCEDURES  Procedure(s) performed (including Critical Care):  Procedures   ____________________________________________   INITIAL IMPRESSION / ASSESSMENT AND PLAN / ED COURSE  As part of my medical decision making, I reviewed the following data within the electronic MEDICAL RECORD NUMBER         Patient presents with 2 to 3 weeks of dry mouth.  Patient also complained of 1 week of right ear pressure.  Differential for sore throat consists of thrush, saliva gland blockage, COVID-19.  Discussed conservative treatment for dry mouth at this time.  Patient given prescription for Sudafed to take for right eustachian tube dysfunction.  Patient advised self quarantine pending results of COVID-19 test.  If test is positive was quarantine additional 10 days.  Patient also advised to follow ENT if he noticed no improvement for dry mouth complaint and 2 weeks.  Return to ED if condition worsens for      ____________________________________________   FINAL CLINICAL IMPRESSION(S) / ED DIAGNOSES  Final diagnoses:  Dry mouth, unspecified  Dysfunction of Eustachian tube, right     ED Discharge Orders         Ordered    antiseptic oral rinse (BIOTENE) LIQD  As needed        05/01/20 1112    pseudoephedrine (SUDAFED) 120 MG 12 hr tablet  2 times daily PRN        05/01/20 1112           *Please note:  [name] was evaluated in Emergency Department on 05/01/2020 for the symptoms described in the history of present illness. She was evaluated in the context of the global COVID-19 pandemic, which necessitated consideration that the patient might be at risk for infection with the SARS-CoV-2 virus that causes COVID-19. Institutional protocols and algorithms that pertain to the evaluation of patients at risk for COVID-19 are in a state of rapid change based on information released by regulatory bodies including the CDC and federal and state organizations. These policies and algorithms were followed during the patient's care in the ED.  Some ED evaluations and interventions may be delayed as a result of limited staffing during and the pandemic.*   Note:  This document was prepared using Dragon voice recognition software and may include unintentional dictation errors.    Joni Reining, PA-C 05/01/20 1123    Shaune Pollack, MD 05/01/20 2014

## 2020-05-01 NOTE — ED Triage Notes (Signed)
Pt reports a dry throat since christmas. Pt states that she drinks juice and water and other drinks but it does not wet her throat. Pt also reports some pain to her right ear

## 2020-05-02 ENCOUNTER — Telehealth: Payer: Self-pay | Admitting: Emergency Medicine

## 2020-05-02 LAB — SARS CORONAVIRUS 2 (TAT 6-24 HRS): SARS Coronavirus 2: POSITIVE — AB

## 2020-05-02 NOTE — Telephone Encounter (Signed)
Called patient to assure she is aware of covid result.  Left messaeg.

## 2020-05-07 NOTE — Telephone Encounter (Signed)
Patient notified of positive COVID-19 test results. Pt verbalized understanding. Pt reports symptoms of dry mouth Criteria for self-isolation if you test positive for COVID-19, regardless of vaccination status:  -If you have mild symptoms that are resolving or have resolved, isolate at home for 5 days since symptoms started AND continue to wear a well-fitted mask when around others in the home and in public for 5 additional days after isolation is completed -If you have a fever and/or moderate to severe symptoms, isolate for at least 10 days since the symptoms started AND until you are fever free for at least 24 hours without the use of fever-reducing medications -If you tested positive and did not have symptoms, isolate for at least 5 days after your positive test  Use over-the-counter medications for symptoms.If you develop respiratory issues/distress, seek medical care in the Emergency Department.  If you must leave home or if you have to be around others please wear a mask. Please limit contact with immediate family members in the home, practice social distancing, frequent handwashing and clean hard surfaces touched frequently with household cleaning products. Members of your household will also need to quarantine and test.You may also be contacted by the health department for follow up.

## 2022-06-14 ENCOUNTER — Emergency Department
Admission: EM | Admit: 2022-06-14 | Discharge: 2022-06-14 | Disposition: A | Discharge status: Home / Self Care | Payer: Medicaid Other | Attending: Emergency Medicine | Admitting: Emergency Medicine

## 2022-06-14 ENCOUNTER — Emergency Department: Payer: Medicaid Other

## 2022-06-14 ENCOUNTER — Other Ambulatory Visit: Payer: Self-pay

## 2022-06-14 DIAGNOSIS — M79671 Pain in right foot: Secondary | ICD-10-CM | POA: Insufficient documentation

## 2022-06-14 NOTE — Discharge Instructions (Addendum)
Pain is likely due to a small heel spur noted on the x-ray.  Consider heel pads in his shoes to help reduce pain inflammation.  Follow-up with podiatry for ongoing treatment.

## 2022-06-14 NOTE — ED Provider Notes (Signed)
[facility] Emergency Department Provider Note     Event Date/Time   First MD Initiated Contact with Patient 06/14/22 1121     (approximate)   History   Foot Pain   HPI  Connie Cooper is a 57 y.o. female with a history of hyperlipidemia and GERD, presents to the ED for nontraumatic right foot pain.  Patient reports several months of intermittent pain to the heel of her right foot.  She reports aggravation with walking as well as achiness when she is lying at night.  She denies any leg swelling, skin changes, recent injury.  She denies any chest pain, shortness of breath, skin changes, or wounds.   Physical Exam   Triage Vital Signs: ED Triage Vitals [06/14/22 1115]  Enc Vitals Group     BP (!) 116/92     Pulse Rate (!) 106     Resp 18     Temp 97.8 F (36.6 C)     Temp Source Oral     SpO2 99 %     Weight 229 lb 15 oz (104.3 kg)     Height 5' 3"$  (1.6 m)     Head Circumference      Peak Flow      Pain Score 2     Pain Loc      Pain Edu?      Excl. in [place]?     Most recent vital signs: Vitals:   06/14/22 1115 06/14/22 1221  BP: (!) 116/92 118/70  Pulse: (!) 106 78  Resp: 18   Temp: 97.8 F (36.6 C) 98 F (36.7 C)  SpO2: 99% 99%    General Awake, no distress. NAD CV:  Good peripheral perfusion.  RESP:  Normal effort.  ABD:  No distention.  MSK:   Right foot without obvious deformity or dislocation.  Tenderness palpation to the plantar heel.  No calf or Achilles tenderness elicited.    ED Results / Procedures / Treatments   Labs (all labs ordered are listed, but only abnormal results are displayed) Labs Reviewed - No data to display   EKG    RADIOLOGY  I personally viewed and evaluated these images as part of my medical decision making, as well as reviewing the written report by the radiologist.  DG Right Foot  ED Provider Interpretation: No acute findings to the right foot.  Evidence of a small calcaneal and Achilles  heel spur.  No results found.   PROCEDURES:  Critical Care performed: No  Procedures   MEDICATIONS ORDERED IN ED: Medications - No data to display   IMPRESSION / MDM / [place] / ED COURSE  I reviewed the triage vital signs and the nursing notes.                              Differential diagnosis includes, but is not limited to, sprain, ankle fracture, heel spur, calcaneal fracture  Patient's presentation is most consistent with acute complicated illness / injury requiring diagnostic workup.  Patient's diagnosis is consistent with heel pain secondary to heel spur. Patient will be discharged home with instructions to take OTC anti-inflammatories, and use a heel insert. Patient is to follow up with podiatry as needed or otherwise directed. Patient is given ED precautions to return to the ED for any worsening or new symptoms.   FINAL CLINICAL IMPRESSION(S) / ED DIAGNOSES   Final diagnoses:  Pain of right heel     Rx / DC Orders   ED Discharge Orders     None        Note:  This document was prepared using Dragon voice recognition software and may include unintentional dictation errors.    Melvenia Needles, PA-C 06/14/22 1236    Duffy Bruce, MD 06/14/22 2013

## 2022-06-14 NOTE — ED Triage Notes (Addendum)
Pt states she is having right foot pain for a while. Pt states she has pain on the heel of her foot when she is walking. Pt states the pain radiates up her leg when she is lying down at night.Pt denies fall, no wound seen on bottom of foot by this RN. Pt ambulatory to triage.
